# Patient Record
Sex: Female | Born: 1995 | Race: Black or African American | Hispanic: No | Marital: Single | Smoking: Never smoker
Health system: Southern US, Community
[De-identification: ages and names within clinical notes are randomized; demographics above are authoritative.]

## PROBLEM LIST (undated history)

## (undated) DIAGNOSIS — J302 Other seasonal allergic rhinitis: Secondary | ICD-10-CM

---
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
## 2017-03-31 ENCOUNTER — Ambulatory Visit: Payer: BLUE CROSS/BLUE SHIELD | Admitting: Sports Medicine

## 2017-03-31 ENCOUNTER — Encounter: Payer: Self-pay | Admitting: Sports Medicine

## 2017-03-31 DIAGNOSIS — Z7189 Other specified counseling: Secondary | ICD-10-CM

## 2017-03-31 DIAGNOSIS — Z7184 Encounter for health counseling related to travel: Secondary | ICD-10-CM | POA: Insufficient documentation

## 2017-03-31 MED ORDER — TYPHOID VACCINE PO CPDR: 1.0000 | DELAYED_RELEASE_CAPSULE | ORAL | 0 refills | Status: DC

## 2017-03-31 MED ORDER — VAXCHORA PO SUSR: 100.0000 mL | Freq: Once | ORAL | 0 refills | Status: AC

## 2017-03-31 NOTE — Progress Notes (Signed)
Subjective:    CC: Travel advice  HPI: This is a 22 year old female, for school she is traveling to [place] for 1 month, staying in [place].  She needs some vaccines and prophylactics.  She has already received traveler's diarrhea prophylaxis, malaria prophylaxis pills.    I reviewed the past medical history, family history, social history, surgical history, and allergies today and no changes were needed.  Please see the problem list section below in epic for further details.  Past Medical History: Past Medical History:  Diagnosis Date  . Seasonal allergies    Past Surgical History: No past surgical history on file. Social History: Social History   Socioeconomic History  . Marital status: Single    Spouse name: Not on file  . Number of children: Not on file  . Years of education: Not on file  . Highest education level: Not on file  Occupational History  . Not on file  Social Needs  . Financial resource strain: Not on file  . Food insecurity:    Worry: Not on file    Inability: Not on file  . Transportation needs:    Medical: Not on file    Non-medical: Not on file  Tobacco Use  . Smoking status: Never Smoker  . Smokeless tobacco: Never Used  Substance and Sexual Activity  . Alcohol use: No  . Drug use: No  . Sexual activity: Never    Birth control/protection: None  Lifestyle  . Physical activity:    Days per week: Not on file    Minutes per session: Not on file  . Stress: Not on file  Relationships  . Social connections:    Talks on phone: Not on file    Gets together: Not on file    Attends religious service: Not on file    Active member of club or organization: Not on file    Attends meetings of clubs or organizations: Not on file    Relationship status: Not on file  Other Topics Concern  . Not on file  Social History Narrative  . Not on file   Family History: Family History  Problem Relation Age of Onset  . Diabetes Mother   . Asthma Mother   .  Cancer Father   . Hypertension Maternal Grandmother   . Hyperlipidemia Maternal Grandmother    Allergies: Allergies  Allergen Reactions  . Peanut-Containing Drug Products    Medications: See med rec.  Review of Systems: No fevers, chills, night sweats, weight loss, chest pain, or shortness of breath.   Objective:    General: Well Developed, well nourished, and in no acute distress.  Neuro: Alert and oriented x3, extra-ocular muscles intact, sensation grossly intact.  HEENT: Normocephalic, atraumatic, pupils equal round reactive to light, neck supple, no masses, no lymphadenopathy, thyroid nonpalpable.  Skin: Warm and dry, no rashes. Cardiac: Regular rate and rhythm, no murmurs rubs or gallops, no lower extremity edema.  Respiratory: Clear to auscultation bilaterally. Not using accessory muscles, speaking in full sentences.  Impression and Recommendations:    Travel advice encounter Traveling to [place], needs prophylaxis for typhoid, Cholera. She has already gotten prescriptions for traveler's diarrhea and malaria prophylaxis. She is going to be going for just a month, and I think her risk for [place] encephalitis is low enough for we do not need to do the vaccine, she is going to [place] which has very low endemic rates. Up-to-date on routine vaccines for flu shot. We are out of flu shots,  she needs to go to a pharmacy for this. She will use mosquito repellent. Several weeks after her return we can do Quantiferon Gold.  I spent 25 minutes with this patient, greater than 50% was face-to-face time counseling regarding the above diagnoses ___________________________________________ Ihor Austinhomas J. Benjamin Stainhekkekandam, M.D., ABFM., CAQSM. Primary Care and Sports Medicine  [facility]  Adjunct Instructor of Family Medicine  [employer]

## 2017-03-31 NOTE — Assessment & Plan Note (Signed)
Traveling to [place], needs prophylaxis for typhoid, Cholera. She has already gotten prescriptions for traveler's diarrhea and malaria prophylaxis. She is going to be going for just a month, and I think her risk for [place] encephalitis is low enough for we do not need to do the vaccine, [place] is going to [place] has very low endemic rates. Up-to-date on routine vaccines for flu shot. We are out of flu shots, she needs to go to a pharmacy for this. She will use mosquito repellent. Several weeks after her return we can do Quantiferon Gold.

## 2017-06-06 ENCOUNTER — Telehealth: Payer: Self-pay | Admitting: Sports Medicine

## 2017-06-06 DIAGNOSIS — Z7184 Encounter for health counseling related to travel: Secondary | ICD-10-CM

## 2017-06-06 MED ORDER — TYPHOID VACCINE PO CPDR: 1.0000 | DELAYED_RELEASE_CAPSULE | ORAL | 0 refills | Status: DC

## 2017-06-06 NOTE — Telephone Encounter (Signed)
Pt called. She did not pick up script for thyphoid (vivotif) Dr capsule and needs it resent to her pharmacy.  She needs to start taking this med today.

## 2017-06-06 NOTE — Telephone Encounter (Signed)
Done

## 2017-06-07 NOTE — Telephone Encounter (Signed)
Thank you :)

## 2017-06-19 ENCOUNTER — Ambulatory Visit: Payer: BLUE CROSS/BLUE SHIELD | Admitting: Sports Medicine

## 2017-06-19 ENCOUNTER — Encounter: Payer: Self-pay | Admitting: Sports Medicine

## 2017-06-19 DIAGNOSIS — L989 Disorder of the skin and subcutaneous tissue, unspecified: Secondary | ICD-10-CM | POA: Diagnosis not present

## 2017-06-19 NOTE — Progress Notes (Signed)
Subjective:     CC: Skin lesion  HPI: This is a pleasant 22 year old female, she has a few small papules on her foot, thighs.  These are less than the size of a dime.  Completely asymptomatic.   I reviewed the past medical history, family history, social history, surgical history, and allergies today and no changes were needed.  Please see the problem list section below in epic for further details.  Past Medical History: Past Medical History:  Diagnosis Date  . Seasonal allergies    Past Surgical History: No past surgical history on file. Social History: Social History   Socioeconomic History  . Marital status: Single    Spouse name: Not on file  . Number of children: Not on file  . Years of education: Not on file  . Highest education level: Not on file  Occupational History  . Not on file  Social Needs  . Financial resource strain: Not on file  . Food insecurity:    Worry: Not on file    Inability: Not on file  . Transportation needs:    Medical: Not on file    Non-medical: Not on file  Tobacco Use  . Smoking status: Never Smoker  . Smokeless tobacco: Never Used  Substance and Sexual Activity  . Alcohol use: No  . Drug use: No  . Sexual activity: Never    Birth control/protection: None  Lifestyle  . Physical activity:    Days per week: Not on file    Minutes per session: Not on file  . Stress: Not on file  Relationships  . Social connections:    Talks on phone: Not on file    Gets together: Not on file    Attends religious service: Not on file    Active member of club or organization: Not on file    Attends meetings of clubs or organizations: Not on file    Relationship status: Not on file  Other Topics Concern  . Not on file  Social History Narrative  . Not on file   Family History: Family History  Problem Relation Age of Onset  . Diabetes Mother   . Asthma Mother   . Cancer Father   . Hypertension Maternal Grandmother   . Hyperlipidemia  Maternal Grandmother    Allergies: Allergies  Allergen Reactions  . Peanut-Containing Drug Products    Medications: See med rec.  Review of Systems: No fevers, chills, night sweats, weight loss, chest pain, or shortness of breath.   Objective:    General: Well Developed, well nourished, and in no acute distress.  Neuro: Alert and oriented x3, extra-ocular muscles intact, sensation grossly intact.  HEENT: Normocephalic, atraumatic, pupils equal round reactive to light, neck supple, no masses, no lymphadenopathy, thyroid nonpalpable.  Skin: Warm and dry, no rashes.  There are a few papules on her thigh, foot, do not appear to be ringworm, nummular eczema. Cardiac: Regular rate and rhythm, no murmurs rubs or gallops, no lower extremity edema.  Respiratory: Clear to auscultation bilaterally. Not using accessory muscles, speaking in full sentences.  Impression and Recommendations:    Skin lesion There are a few unremarkable appearing papules on her foot, thigh. This does not appear to be nummular eczema, tinea corporis, no specific intervention needed.  I spent 25 minutes with this patient, greater than 50% was face-to-face time counseling regarding the above diagnoses ___________________________________________ Ihor Austinhomas J. Benjamin Stainhekkekandam, M.D., ABFM., CAQSM. Primary Care and Sports Medicine [facility]  Adjunct Instructor  of [employer]

## 2017-06-19 NOTE — Assessment & Plan Note (Signed)
There are a few unremarkable appearing papules on her foot, thigh. This does not appear to be nummular eczema, tinea corporis, no specific intervention needed.

## 2017-06-22 ENCOUNTER — Encounter: Payer: BLUE CROSS/BLUE SHIELD | Admitting: Sports Medicine

## 2017-12-29 ENCOUNTER — Other Ambulatory Visit: Payer: Self-pay | Admitting: Sports Medicine

## 2017-12-29 DIAGNOSIS — L700 Acne vulgaris: Secondary | ICD-10-CM

## 2018-01-13 IMAGING — CR DG THORACIC SPINE 3V
3 series · 3 of 3 positions shown · non-contrast
Comparison: No prior.

CLINICAL DATA: Pain. Questionable prior tennis injury. Initial
evaluation.

EXAM:
THORACIC SPINE - 3 VIEWS

[t-spine ap]
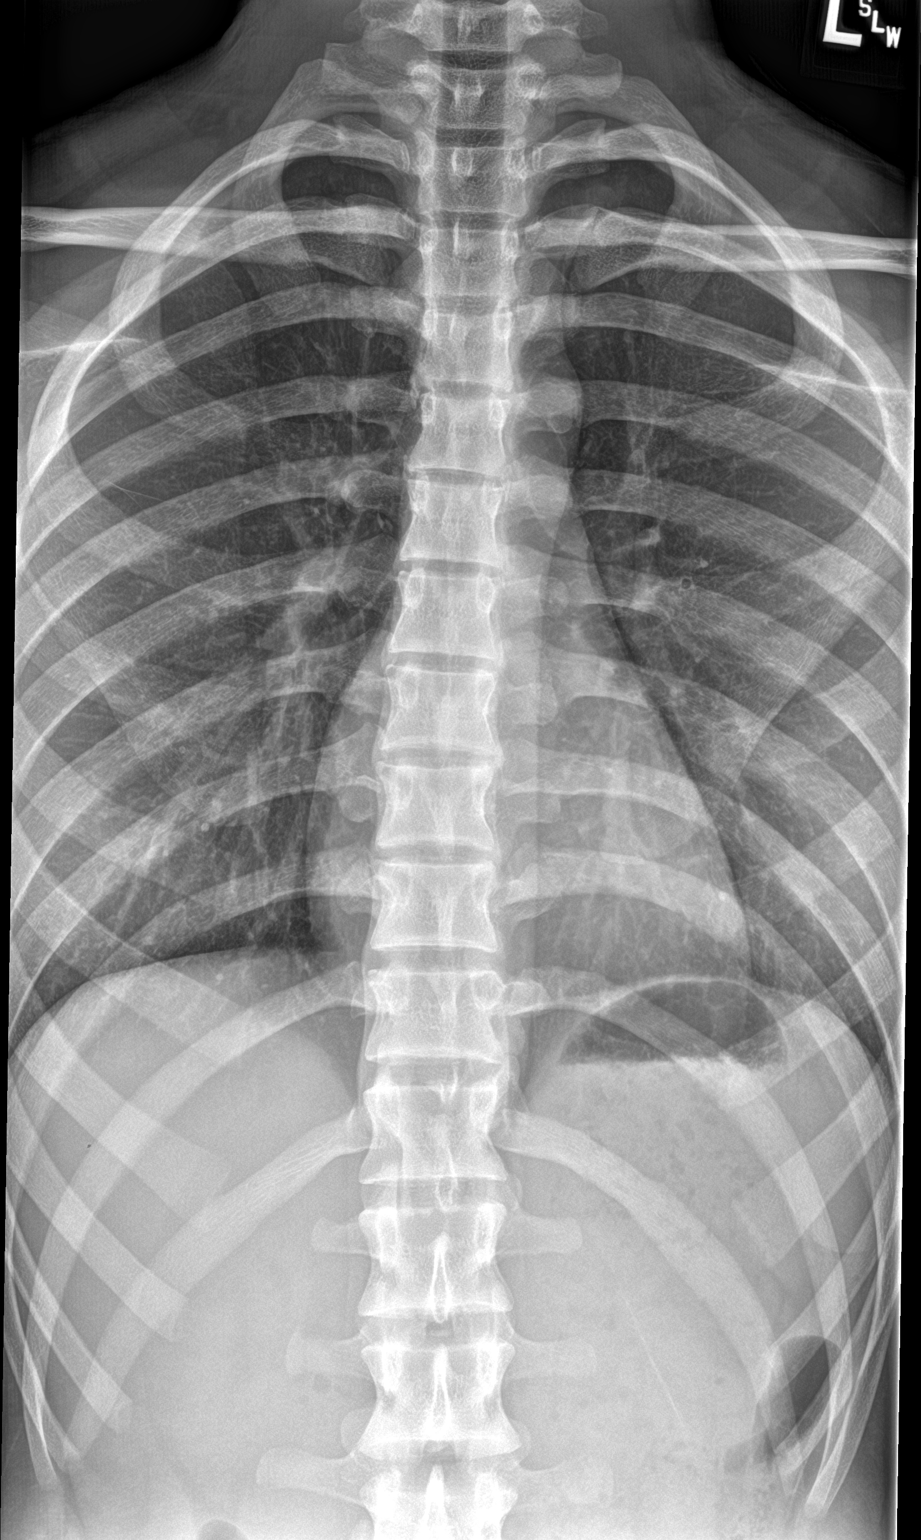

[t-spine lat]
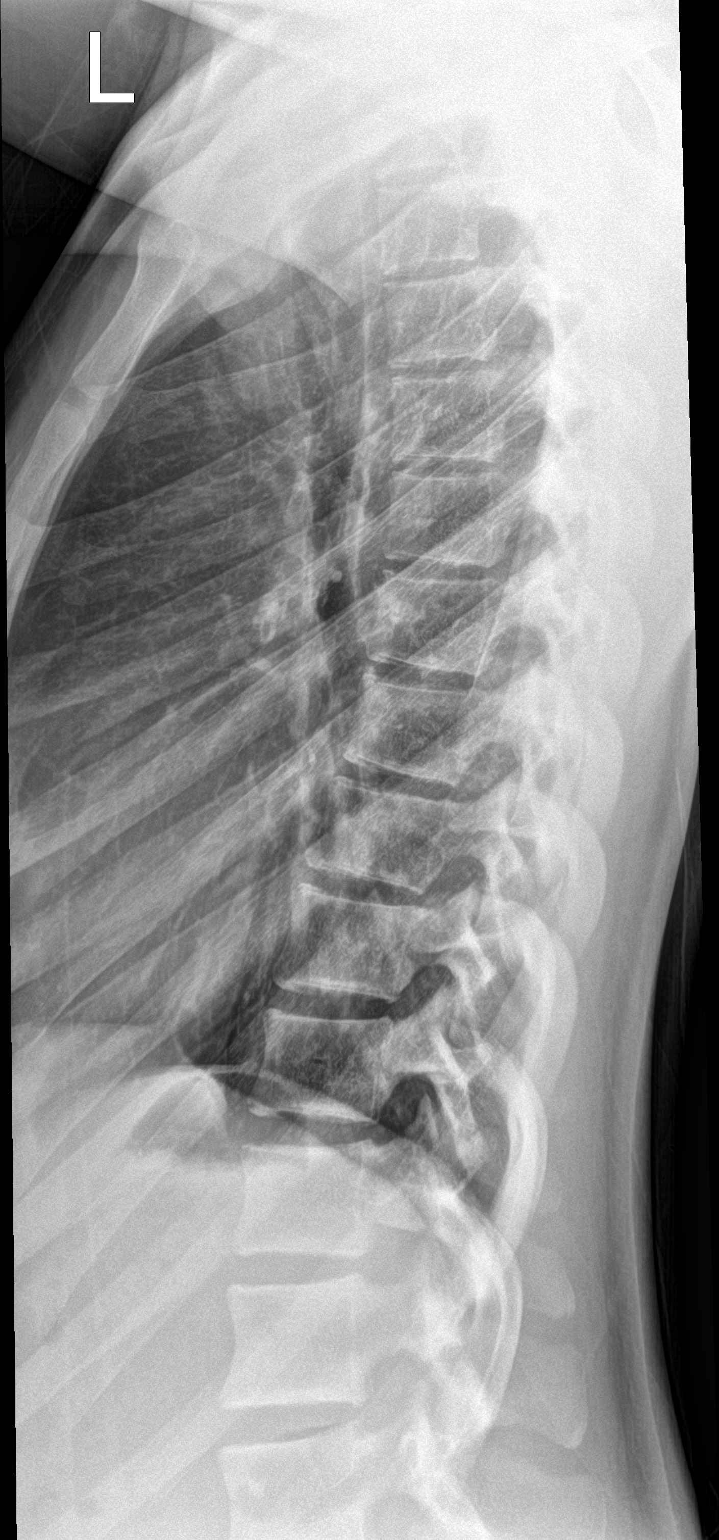

[t-spine swimmers]
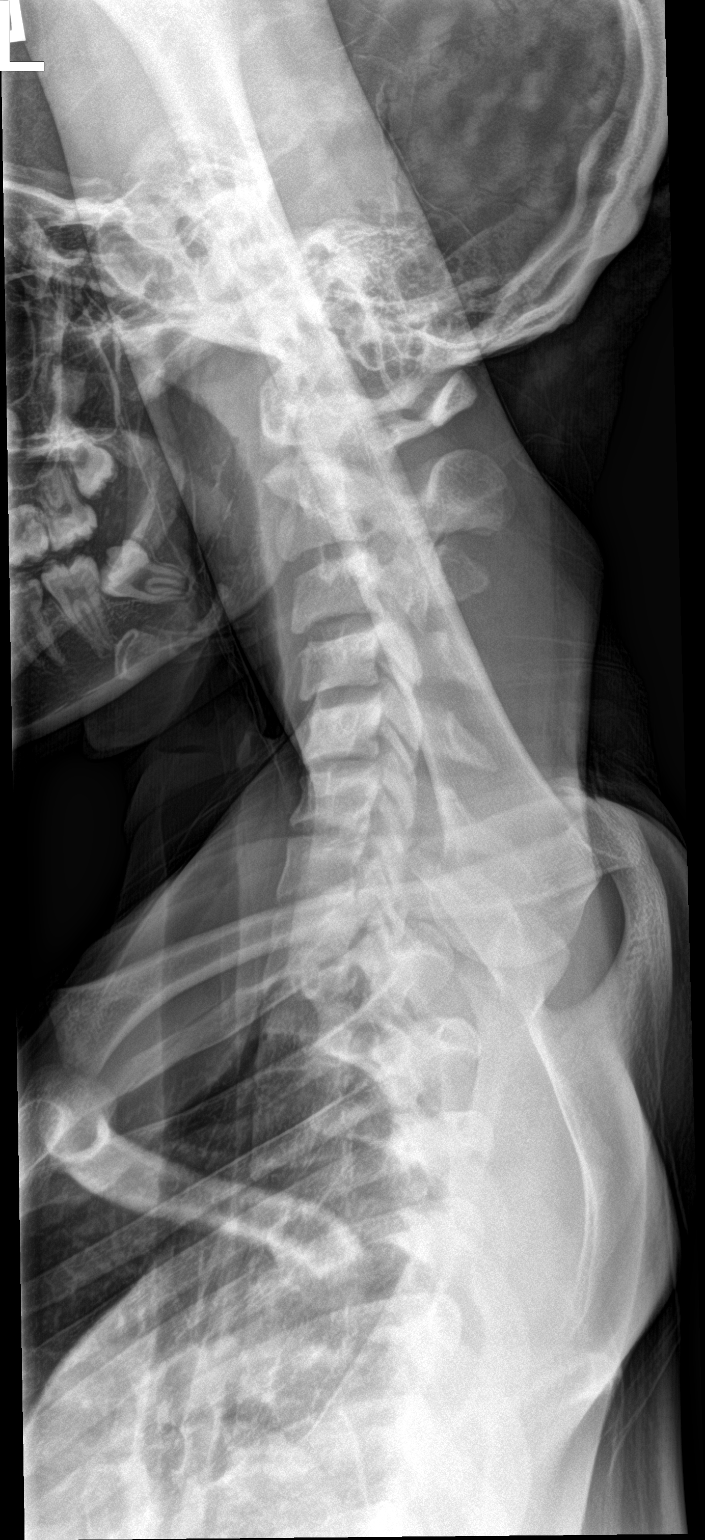

[3 of 3 positions shown; findings below may reference images not displayed]

FINDINGS: Loss of normal cervical lordosis. This may be related to
positioning. Mild mid thoracic spine scoliosis, 5 degrees concave
left. Normal mineralization. No acute bony abnormality. No
significant degenerative change.
IMPRESSION: Mild thoracic spine scoliosis.  No acute abnormality.

## 2018-03-19 ENCOUNTER — Telehealth: Payer: Self-pay

## 2018-03-19 NOTE — Telephone Encounter (Addendum)
Pt left message for Triage to call back, no information provided.   I returned call and spoke with pt's father. 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 SX include, shortness of breath, chest pain, and cough. No fever. Father expresses wanting patient to be seen on Thursday by Dr T and tested.  I advised father that if patient is afraid she may have this, she needs to seek emergency care in [place] at local emergency room or urgent care. Advised father that patient does NOT needs to wait until Thursday and does NOT need to be traveling if she is afraid she has been exposed.   Father understands and states he will make sure patient is seen sooner in [place] prior to traveling back to [facility]. I also advised father to make sure patient receives copies of all her paperwork from where she is seen in [place]. FYI to PCP.

## 2018-03-19 NOTE — Telephone Encounter (Signed)
Stay at home.  Supportive treatment only.  Doing the test wont change the management of the situation.  If she is truly having shortness of breath and chest pain then she needs to be seen by somebody today.  Please ask him if he is not concerned about the FAR more common and FAR more relevant flu or pneumonia?

## 2018-03-19 NOTE — Telephone Encounter (Signed)
Pt's "home" is currenly [place], she is remaining there until she can be seen by medical professional and rested for whatever is appropriate (flu, strep, COVID-19, etc.)

## 2019-01-22 ENCOUNTER — Other Ambulatory Visit: Payer: Self-pay | Admitting: *Deleted

## 2019-01-22 DIAGNOSIS — L700 Acne vulgaris: Secondary | ICD-10-CM

## 2019-01-22 MED ORDER — CLINDAMYCIN PHOS-BENZOYL PEROX 1.2-5 % EX GEL: CUTANEOUS | 11 refills | Status: AC

## 2019-12-23 ENCOUNTER — Ambulatory Visit: Payer: Self-pay | Attending: Internal Medicine

## 2019-12-23 DIAGNOSIS — Z23 Encounter for immunization: Secondary | ICD-10-CM

## 2019-12-23 NOTE — Progress Notes (Signed)
   Covid-19 Vaccination Clinic  Name:  Erin Crosby    MRN: [id number] DOB: Nov 15, 1995  12/23/2019  [name] was observed post Covid-19 immunization for 15 minutes without incident. She was provided with Vaccine Information Sheet and instruction to access the V-Safe system.   [name] was instructed to call 911 with any severe reactions post vaccine: Marland Kitchen Difficulty breathing  . Swelling of face and throat  . A fast heartbeat  . A bad rash all over body  . Dizziness and weakness   Immunizations Administered    Name Date Dose VIS Date Route   Pfizer COVID-19 Vaccine 12/23/2019  1:36 PM 0.3 mL 10/23/2019 Intramuscular   Manufacturer: [employer], [employer]   Lot: [id number]   NDC: [id number]

## 2020-04-08 ENCOUNTER — Other Ambulatory Visit: Payer: Self-pay

## 2020-04-08 DIAGNOSIS — Z91010 Allergy to peanuts: Secondary | ICD-10-CM

## 2020-04-08 MED ORDER — EPINEPHRINE 0.3 MG/0.3ML IJ SOAJ: 0.3000 mg | Freq: Once | INTRAMUSCULAR | 0 refills | Status: DC

## 2020-04-08 NOTE — Telephone Encounter (Signed)
Parent aware prescription was sent to pharmacy.

## 2020-04-10 ENCOUNTER — Other Ambulatory Visit: Payer: Self-pay | Admitting: Sports Medicine

## 2020-04-10 MED ORDER — EPINEPHRINE 0.3 MG/0.3ML IJ SOAJ: 0.3000 mg | INTRAMUSCULAR | 11 refills | Status: AC | PRN

## 2022-09-15 ENCOUNTER — Telehealth: Payer: Self-pay | Admitting: Sports Medicine

## 2022-09-15 NOTE — Telephone Encounter (Signed)
Prescription Request  09/15/2022  LOV: 06/21/2017  What is the name of the medication or equipment? Clindamycin-Benzoyl Per, Refr, gel  Have you contacted your pharmacy to request a refill? Yes   Which pharmacy would you like this sent to?   [id number] - [place], [place] - [address] AT [facility] [address] [place] [address] Phone: [phone] Fax: [phone]   Patient notified that their request is being sent to the clinical staff for review and that they should receive a response within 2 business days.   Please advise at [facility] [phone]    Advised patient that she would probably need an appointment

## 2022-09-15 NOTE — Telephone Encounter (Signed)
Yeah you are right, I have not seen her in 5 years, she needs to reestablish care.
